# Patient Record
Sex: Male | Born: 1995 | Race: Black or African American | Hispanic: No | Marital: Single | Smoking: Never smoker
Health system: Southern US, Community
[De-identification: ages and names within clinical notes are randomized; demographics above are authoritative.]

## PROBLEM LIST (undated history)

## (undated) HISTORY — PX: NO PAST SURGERIES: SHX2092

---
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
## 2015-01-06 ENCOUNTER — Ambulatory Visit (INDEPENDENT_AMBULATORY_CARE_PROVIDER_SITE_OTHER): Payer: 59

## 2015-01-06 ENCOUNTER — Ambulatory Visit (INDEPENDENT_AMBULATORY_CARE_PROVIDER_SITE_OTHER): Payer: 59 | Admitting: Family Medicine

## 2015-01-06 VITALS — BP 140/108 | HR 86 | Temp 98.9°F | Resp 16 | Ht 69.5 in | Wt 176.0 lb

## 2015-01-06 DIAGNOSIS — M25562 Pain in left knee: Secondary | ICD-10-CM | POA: Diagnosis not present

## 2015-01-06 DIAGNOSIS — M7042 Prepatellar bursitis, left knee: Secondary | ICD-10-CM

## 2015-01-06 LAB — POCT CBC
Granulocyte percent: 78.8 %G (ref 37–80)
HEMATOCRIT: 45.7 % (ref 43.5–53.7)
Hemoglobin: 15.3 g/dL (ref 14.1–18.1)
LYMPH, POC: 2.2 (ref 0.6–3.4)
MCH, POC: 28.4 pg (ref 27–31.2)
MCHC: 33.4 g/dL (ref 31.8–35.4)
MCV: 84.9 fL (ref 80–97)
MID (cbc): 0.5 (ref 0–0.9)
MPV: 7.1 fL (ref 0–99.8)
POC Granulocyte: 10.1 — AB (ref 2–6.9)
POC LYMPH %: 17.2 % (ref 10–50)
POC MID %: 4 % (ref 0–12)
Platelet Count, POC: 194 10*3/uL (ref 142–424)
RBC: 5.38 M/uL (ref 4.69–6.13)
RDW, POC: 13 %
WBC: 12.8 10*3/uL — AB (ref 4.6–10.2)

## 2015-01-06 LAB — SYNOVIAL CELL COUNT + DIFF, W/ CRYSTALS: Crystals, Fluid: NONE SEEN

## 2015-01-06 LAB — POCT SEDIMENTATION RATE: POCT SED RATE: 6 mm/hr (ref 0–22)

## 2015-01-06 NOTE — Progress Notes (Signed)
  Subjective:  Patient ID: Sean [name], male    DOB: 10-12-1995  Age: 19 y.o. MRN: [id number]  Patient is here with a two-day history of swelling and pain in his left knee. He has been lifeguarding. He doesn't know of any injury. Does not recall when it happened but it just gradually got worse to visualize and Thursday so he came in this morning, Thursday. Has not had this problem before. He is going to be going to community college this winter.   Objective:   Left knee is visibly swollen. It is warm to touch. The swelling seems to be mostly in front of the patella. This area is tender, fluctuant, Russell joint space does not seem to be swollen or tender. No skin lesions are noted except for some old scars. Range of motion is good.  Assessment & Plan:   Assessment:  Prepatellar pain and swelling left knee  Plan:  X-ray left knee [facility] reading (PRIMARY) by  Dr. Alwyn Ren Normal knee  Results for orders placed or performed in visit on 01/06/15  POCT CBC  Result Value Ref Range   WBC 12.8 (A) 4.6 - 10.2 K/uL   Lymph, poc 2.2 0.6 - 3.4   POC LYMPH PERCENT 17.2 10 - 50 %L   MID (cbc) 0.5 0 - 0.9   POC MID % 4.0 0 - 12 %M   POC Granulocyte 10.1 (A) 2 - 6.9   Granulocyte percent 78.8 37 - 80 %G   RBC 5.38 4.69 - 6.13 M/uL   Hemoglobin 15.3 14.1 - 18.1 g/dL   HCT, POC 27.2 53.6 - 53.7 %   MCV 84.9 80 - 97 fL   MCH, POC 28.4 27 - 31.2 pg   MCHC 33.4 31.8 - 35.4 g/dL   RDW, POC 64.4 %   Platelet Count, POC 194.0 142 - 424 K/uL   MPV 7.1 0 - 99.8 fL   . Procedure note: 1% lidocaine was knee used to numb the skin on the left knee. Area was prepped with iodine and alcohol. Aspiration was attempted with a 18-gauge needle. Only a few drops of a yellowish fluid was obtained, then a little blood was obtained. Some of the fluid put on a culturette to be cultured, and the remainder sent for microscopic analysis. Specimen is not really adequate for a good exam.  I spoke with Dr. Madelon Lips at  [facility] and he will see the patient if the patient will go over there right now. Patient Instructions  Apply ice to joint for about 15 minutes 5 or 6 times today  Referral is being made to orthopedics.  I spoke to Dr. Madelon Lips at [facility], [address] [place] [phone] who will see you now   High acuity visit  Giles Currie, MD 01/06/2015

## 2015-01-06 NOTE — Patient Instructions (Addendum)
Apply ice to joint for about 15 minutes 5 or 6 times today  Referral is being made to orthopedics.  I spoke to Dr. Madelon Lips at [facility], [address] [place] [phone] who will see you now

## 2015-01-06 NOTE — Addendum Note (Signed)
Addended by: Johnnette Litter on: 01/06/2015 09:39 AM   Modules accepted: Orders, SmartSet

## 2015-01-07 ENCOUNTER — Encounter: Payer: Self-pay | Admitting: Family Medicine

## 2015-01-07 ENCOUNTER — Telehealth: Payer: Self-pay

## 2015-01-07 NOTE — Telephone Encounter (Signed)
Dr. Alwyn Ren, there was insufficient sample for them to run the Crystal Analysis on this pt's synovial fluid

## 2015-01-09 LAB — BODY FLUID CULTURE
Gram Stain: NONE SEEN
Organism ID, Bacteria: NO GROWTH

## 2015-01-10 NOTE — Telephone Encounter (Signed)
Insufficient sample noted. I thought that would happen. It was worth a try.

## 2015-01-11 ENCOUNTER — Encounter: Payer: Self-pay | Admitting: Family Medicine

## 2015-01-12 ENCOUNTER — Encounter (HOSPITAL_BASED_OUTPATIENT_CLINIC_OR_DEPARTMENT_OTHER): Payer: Self-pay | Admitting: *Deleted

## 2015-01-12 NOTE — H&P (Signed)
  [facility] [address]   [address] [place], [place] [address] [phone] A Division of [facility]  Loreta Ave, M.D.   Robert A. Thurston Hole, M.D.   Burnell Blanks, M.D.   Eulas Post, M.D.   Lunette Stands, M.D. Jewel Baize. Eulah Pont, M.D.  Buford Dresser, M.D.  Estell Harpin, M.D.    Melina Fiddler, M.D. Janalee Dane, PA- C  Mary L. Dub Mikes, PA-C  Kirstin A. Shepperson, PA-C  Josh Felts Mills, PA-C  [place], [place]  RE: Sean, [name]   [id number]      DOB: [birth year] PROGRESS NOTE: 01-11-2015  SUBJECTIVE: Sean [name] returns in follow-up. He saw Dr. Madelon Lips at the end of last week with a septic prepatellar bursitis. His bursa was opened and packed. He was started on Doxycycline. I had seen him in our after-hours urgent care on Saturday and the packing was removed. He was afebrile and had no systemic symptoms of illness.  He was seemingly improving. I continued him on oral antibiotics. He comes in today for a follow-up with again no fevers or chills. No constitutional signs of illness but still with pain to his knee.   Interval medical history is unremarkable and unchanged upon review.   OBJECTIVE: There is pus draining from the wound. There is some fluctuance to the lateral anterior prepatellar bursa. There is a surrounding significant warmth and erythema. There is no intra-articular effusion. He has a pain free range of motion. He is neurovascularly intact distally.   X-RAYS: None repeated.   ASSESSMENT: Septic left knee, prepatellar bursitis.   PLAN: Rocephin 250 mg IM injection. Continue with oral Doxycycline. Follow-up with Dr. Renaye Rakers tomorrow.   If no improvement with antibiotic injection anticipate a formal irrigation and debridement and washing out of his infected prepatellar bursa.   Estell Harpin, M.D.  Electronically verified by Estell Harpin, M.D. JSK:[id number]  D  01-11-2015 T   01-12-2015

## 2015-01-14 ENCOUNTER — Ambulatory Visit (HOSPITAL_BASED_OUTPATIENT_CLINIC_OR_DEPARTMENT_OTHER)
Admission: RE | Admit: 2015-01-14 | Discharge: 2015-01-14 | Disposition: A | Discharge status: Home / Self Care | Payer: 59 | Source: Ambulatory Visit | Attending: Orthopedic Surgery | Admitting: Orthopedic Surgery

## 2015-01-14 ENCOUNTER — Ambulatory Visit (HOSPITAL_BASED_OUTPATIENT_CLINIC_OR_DEPARTMENT_OTHER): Payer: 59 | Admitting: Anesthesiology

## 2015-01-14 ENCOUNTER — Encounter (HOSPITAL_BASED_OUTPATIENT_CLINIC_OR_DEPARTMENT_OTHER): Payer: Self-pay | Admitting: *Deleted

## 2015-01-14 ENCOUNTER — Encounter (HOSPITAL_BASED_OUTPATIENT_CLINIC_OR_DEPARTMENT_OTHER)
Admission: RE | Disposition: A | Discharge status: Home / Self Care | Payer: Self-pay | Source: Ambulatory Visit | Attending: Orthopedic Surgery

## 2015-01-14 DIAGNOSIS — M7042 Prepatellar bursitis, left knee: Secondary | ICD-10-CM | POA: Diagnosis not present

## 2015-01-14 DIAGNOSIS — M71062 Abscess of bursa, left knee: Secondary | ICD-10-CM | POA: Insufficient documentation

## 2015-01-14 HISTORY — PX: KNEE BURSECTOMY: SHX5882

## 2015-01-14 HISTORY — PX: I&D EXTREMITY: SHX5045

## 2015-01-14 LAB — POCT HEMOGLOBIN-HEMACUE: Hemoglobin: 15.3 g/dL (ref 13.0–17.0)

## 2015-01-14 SURGERY — BURSECTOMY, KNEE
Anesthesia: Regional | Site: Knee | Laterality: Left

## 2015-01-14 MED ORDER — LACTATED RINGERS IV SOLN
INTRAVENOUS | Status: DC
  Administered 2015-01-14: 12:00:00 via INTRAVENOUS

## 2015-01-14 MED ORDER — CEFAZOLIN SODIUM-DEXTROSE 2-3 GM-% IV SOLR
2.0000 g | INTRAVENOUS | Status: AC
  Administered 2015-01-14: 2 g via INTRAVENOUS

## 2015-01-14 MED ORDER — MIDAZOLAM HCL 2 MG/2ML IJ SOLN
INTRAMUSCULAR | Status: AC
  Filled 2015-01-14: qty 2

## 2015-01-14 MED ORDER — ACETAMINOPHEN 500 MG PO TABS
ORAL_TABLET | ORAL | Status: AC
  Filled 2015-01-14: qty 2

## 2015-01-14 MED ORDER — OXYCODONE HCL 5 MG PO TABS: 10.0000 mg | ORAL_TABLET | ORAL | Status: AC | PRN

## 2015-01-14 MED ORDER — OXYCODONE HCL 5 MG PO TABS: 5.0000 mg | ORAL_TABLET | Freq: Once | ORAL | Status: DC | PRN

## 2015-01-14 MED ORDER — DEXAMETHASONE SODIUM PHOSPHATE 4 MG/ML IJ SOLN
INTRAMUSCULAR | Status: DC | PRN
  Administered 2015-01-14: 10 mg via INTRAVENOUS

## 2015-01-14 MED ORDER — GLYCOPYRROLATE 0.2 MG/ML IJ SOLN: 0.2000 mg | Freq: Once | INTRAMUSCULAR | Status: DC | PRN

## 2015-01-14 MED ORDER — PROPOFOL 10 MG/ML IV BOLUS
INTRAVENOUS | Status: DC | PRN
  Administered 2015-01-14: 250 mg via INTRAVENOUS

## 2015-01-14 MED ORDER — FENTANYL CITRATE (PF) 100 MCG/2ML IJ SOLN
INTRAMUSCULAR | Status: AC
  Filled 2015-01-14: qty 8

## 2015-01-14 MED ORDER — OXYCODONE HCL 5 MG/5ML PO SOLN: 5.0000 mg | Freq: Once | ORAL | Status: DC | PRN

## 2015-01-14 MED ORDER — ACETAMINOPHEN 500 MG PO TABS
1000.0000 mg | ORAL_TABLET | Freq: Once | ORAL | Status: AC
  Administered 2015-01-14: 1000 mg via ORAL

## 2015-01-14 MED ORDER — LIDOCAINE HCL (CARDIAC) 20 MG/ML IV SOLN
INTRAVENOUS | Status: DC | PRN
  Administered 2015-01-14: 60 mg via INTRAVENOUS

## 2015-01-14 MED ORDER — DEXTROSE-NACL 5-0.45 % IV SOLN: 100.0000 mL/h | INTRAVENOUS | Status: DC

## 2015-01-14 MED ORDER — PROPOFOL 10 MG/ML IV BOLUS
INTRAVENOUS | Status: AC
  Filled 2015-01-14: qty 20

## 2015-01-14 MED ORDER — FENTANYL CITRATE (PF) 100 MCG/2ML IJ SOLN
50.0000 ug | INTRAMUSCULAR | Status: DC | PRN
  Administered 2015-01-14: 100 ug via INTRAVENOUS

## 2015-01-14 MED ORDER — ONDANSETRON HCL 4 MG/2ML IJ SOLN
INTRAMUSCULAR | Status: DC | PRN
  Administered 2015-01-14: 4 mg via INTRAVENOUS

## 2015-01-14 MED ORDER — BUPIVACAINE-EPINEPHRINE (PF) 0.5% -1:200000 IJ SOLN
INTRAMUSCULAR | Status: DC | PRN
  Administered 2015-01-14: 30 mL via PERINEURAL

## 2015-01-14 MED ORDER — CEFAZOLIN SODIUM-DEXTROSE 2-3 GM-% IV SOLR
INTRAVENOUS | Status: AC
  Filled 2015-01-14: qty 50

## 2015-01-14 MED ORDER — MIDAZOLAM HCL 2 MG/2ML IJ SOLN
1.0000 mg | INTRAMUSCULAR | Status: DC | PRN
  Administered 2015-01-14: 2 mg via INTRAVENOUS

## 2015-01-14 MED ORDER — SCOPOLAMINE 1 MG/3DAYS TD PT72: 1.0000 | MEDICATED_PATCH | Freq: Once | TRANSDERMAL | Status: DC | PRN

## 2015-01-14 MED ORDER — FENTANYL CITRATE (PF) 100 MCG/2ML IJ SOLN
INTRAMUSCULAR | Status: AC
  Filled 2015-01-14: qty 2

## 2015-01-14 MED ORDER — HYDROMORPHONE HCL 1 MG/ML IJ SOLN: 0.2500 mg | INTRAMUSCULAR | Status: DC | PRN

## 2015-01-14 MED ORDER — ONDANSETRON HCL 4 MG PO TABS: 4.0000 mg | ORAL_TABLET | Freq: Three times a day (TID) | ORAL | Status: AC | PRN

## 2015-01-14 MED ORDER — ONDANSETRON HCL 4 MG/2ML IJ SOLN: 4.0000 mg | Freq: Four times a day (QID) | INTRAMUSCULAR | Status: DC | PRN

## 2015-01-14 SURGICAL SUPPLY — 91 items
BAG DECANTER FOR FLEXI CONT (MISCELLANEOUS) IMPLANT
BANDAGE ELASTIC 4 VELCRO ST LF (GAUZE/BANDAGES/DRESSINGS) IMPLANT
BANDAGE ELASTIC 6 VELCRO ST LF (GAUZE/BANDAGES/DRESSINGS) ×3 IMPLANT
BANDAGE ESMARK 6X9 LF (GAUZE/BANDAGES/DRESSINGS) ×1 IMPLANT
BLADE SURG 10 STRL SS (BLADE) IMPLANT
BLADE SURG 15 STRL LF DISP TIS (BLADE) ×2 IMPLANT
BLADE SURG 15 STRL SS (BLADE) ×4
BNDG COHESIVE 6X5 TAN STRL LF (GAUZE/BANDAGES/DRESSINGS) IMPLANT
BNDG ESMARK 6X9 LF (GAUZE/BANDAGES/DRESSINGS) ×3
BNDG GAUZE ELAST 4 BULKY (GAUZE/BANDAGES/DRESSINGS) IMPLANT
CHLORAPREP W/TINT 26ML (MISCELLANEOUS) ×3 IMPLANT
CLEANER CAUTERY TIP 5X5 PAD (MISCELLANEOUS) IMPLANT
CLOSURE STERI-STRIP 1/2X4 (GAUZE/BANDAGES/DRESSINGS) ×1
CLSR STERI-STRIP ANTIMIC 1/2X4 (GAUZE/BANDAGES/DRESSINGS) ×2 IMPLANT
COVER BACK TABLE 80X110 HD (DRAPES) ×3 IMPLANT
COVER MAYO STAND STRL (DRAPES) IMPLANT
CUFF TOURNIQUET SINGLE 34IN LL (TOURNIQUET CUFF) ×3 IMPLANT
DECANTER SPIKE VIAL GLASS SM (MISCELLANEOUS) IMPLANT
DRAPE EXTREMITY T 121X128X90 (DRAPE) ×3 IMPLANT
DRAPE OEC MINIVIEW 54X84 (DRAPES) IMPLANT
DRAPE SURG 17X23 STRL (DRAPES) IMPLANT
DRAPE U 20/CS (DRAPES) IMPLANT
DRAPE U-SHAPE 47X51 STRL (DRAPES) ×3 IMPLANT
DRSG ADAPTIC 3X8 NADH LF (GAUZE/BANDAGES/DRESSINGS) ×3 IMPLANT
DRSG EMULSION OIL 3X3 NADH (GAUZE/BANDAGES/DRESSINGS) ×3 IMPLANT
DRSG PAD ABDOMINAL 8X10 ST (GAUZE/BANDAGES/DRESSINGS) ×3 IMPLANT
ELECT REM PT RETURN 9FT ADLT (ELECTROSURGICAL) ×3
ELECTRODE REM PT RTRN 9FT ADLT (ELECTROSURGICAL) ×1 IMPLANT
GAUZE IODOFORM PACK 1/2 7832 (GAUZE/BANDAGES/DRESSINGS) ×3 IMPLANT
GAUZE SPONGE 4X4 12PLY STRL (GAUZE/BANDAGES/DRESSINGS) ×3 IMPLANT
GLOVE BIO SURGEON STRL SZ 6.5 (GLOVE) ×2 IMPLANT
GLOVE BIO SURGEON STRL SZ7 (GLOVE) ×3 IMPLANT
GLOVE BIO SURGEON STRL SZ7.5 (GLOVE) ×3 IMPLANT
GLOVE BIO SURGEON STRL SZ8 (GLOVE) ×3 IMPLANT
GLOVE BIO SURGEONS STRL SZ 6.5 (GLOVE) ×1
GLOVE BIOGEL PI IND STRL 7.0 (GLOVE) ×2 IMPLANT
GLOVE BIOGEL PI IND STRL 8 (GLOVE) ×1 IMPLANT
GLOVE BIOGEL PI INDICATOR 7.0 (GLOVE) ×4
GLOVE BIOGEL PI INDICATOR 8 (GLOVE) ×2
GOWN STRL REUS W/ TWL LRG LVL3 (GOWN DISPOSABLE) ×1 IMPLANT
GOWN STRL REUS W/ TWL XL LVL3 (GOWN DISPOSABLE) ×2 IMPLANT
GOWN STRL REUS W/TWL LRG LVL3 (GOWN DISPOSABLE) ×2
GOWN STRL REUS W/TWL XL LVL3 (GOWN DISPOSABLE) ×4
IMMOBILIZER KNEE 22 UNIV (SOFTGOODS) IMPLANT
IMMOBILIZER KNEE 24 THIGH 36 (MISCELLANEOUS) IMPLANT
IMMOBILIZER KNEE 24 UNIV (MISCELLANEOUS)
NDL SUT 6 .5 CRC .975X.05 MAYO (NEEDLE) IMPLANT
NEEDLE HYPO 25X1 1.5 SAFETY (NEEDLE) IMPLANT
NEEDLE MAYO TAPER (NEEDLE)
NEEDLE MAYO TROCAR (NEEDLE) IMPLANT
NS IRRIG 1000ML POUR BTL (IV SOLUTION) ×3 IMPLANT
PACK ARTHROSCOPY DSU (CUSTOM PROCEDURE TRAY) ×3 IMPLANT
PACK BASIN DAY SURGERY FS (CUSTOM PROCEDURE TRAY) ×3 IMPLANT
PAD ARMBOARD 7.5X6 YLW CONV (MISCELLANEOUS) ×6 IMPLANT
PAD CAST 3X4 CTTN HI CHSV (CAST SUPPLIES) IMPLANT
PAD CAST 4YDX4 CTTN HI CHSV (CAST SUPPLIES) IMPLANT
PAD CLEANER CAUTERY TIP 5X5 (MISCELLANEOUS)
PADDING CAST COTTON 3X4 STRL (CAST SUPPLIES)
PADDING CAST COTTON 4X4 STRL (CAST SUPPLIES)
PADDING CAST COTTON 6X4 STRL (CAST SUPPLIES) ×3 IMPLANT
PENCIL BUTTON HOLSTER BLD 10FT (ELECTRODE) ×3 IMPLANT
SET IRRIG Y TYPE TUR BLADDER L (SET/KITS/TRAYS/PACK) ×3 IMPLANT
SLEEVE SCD COMPRESS KNEE MED (MISCELLANEOUS) IMPLANT
SPONGE GAUZE 4X4 12PLY STER LF (GAUZE/BANDAGES/DRESSINGS) ×3 IMPLANT
SPONGE LAP 18X18 X RAY DECT (DISPOSABLE) ×3 IMPLANT
SPONGE LAP 4X18 X RAY DECT (DISPOSABLE) ×3 IMPLANT
STAPLER VISISTAT 35W (STAPLE) IMPLANT
SUCTION FRAZIER TIP 10 FR DISP (SUCTIONS) IMPLANT
SUT ETHILON 3 0 PS 1 (SUTURE) ×3 IMPLANT
SUT FIBERWIRE #2 38 T-5 BLUE (SUTURE)
SUT MNCRL AB 4-0 PS2 18 (SUTURE) ×3 IMPLANT
SUT MON AB 2-0 CT1 36 (SUTURE) IMPLANT
SUT VIC AB 0 CT1 18XCR BRD 8 (SUTURE) IMPLANT
SUT VIC AB 0 CT1 27 (SUTURE) ×2
SUT VIC AB 0 CT1 27XBRD ANBCTR (SUTURE) ×1 IMPLANT
SUT VIC AB 0 CT1 8-18 (SUTURE)
SUT VIC AB 0 SH 27 (SUTURE) IMPLANT
SUT VIC AB 1 CT1 27 (SUTURE)
SUT VIC AB 1 CT1 27XBRD ANBCTR (SUTURE) IMPLANT
SUT VIC AB 2-0 SH 27 (SUTURE)
SUT VIC AB 2-0 SH 27XBRD (SUTURE) IMPLANT
SUTURE FIBERWR #2 38 T-5 BLUE (SUTURE) IMPLANT
SYR BULB 3OZ (MISCELLANEOUS) ×3 IMPLANT
SYR CONTROL 10ML LL (SYRINGE) IMPLANT
TOWEL OR 17X24 6PK STRL BLUE (TOWEL DISPOSABLE) ×3 IMPLANT
TOWEL OR NON WOVEN STRL DISP B (DISPOSABLE) ×3 IMPLANT
TRAY DSU PREP LF (CUSTOM PROCEDURE TRAY) IMPLANT
TUBE CONNECTING 20'X1/4 (TUBING) ×1
TUBE CONNECTING 20X1/4 (TUBING) ×2 IMPLANT
UNDERPAD 30X30 (UNDERPADS AND DIAPERS) ×3 IMPLANT
YANKAUER SUCT BULB TIP NO VENT (SUCTIONS) ×3 IMPLANT

## 2015-01-14 NOTE — Op Note (Signed)
01/14/2015  1:27 PM  PATIENT:  Sean [name]    PRE-OPERATIVE DIAGNOSIS:  prepatellar bursitis left knee abscess of bursa left knee   POST-OPERATIVE DIAGNOSIS:  Same  PROCEDURE:  LEFT KNEE PRE-PATELLAR BURSECTOMY, IRRIGATION AND DEBRIDEMENT LEFT KNEE ABSCESS   SURGEON:  MURPHY, TIMOTHY D, MD  ASSISTANT: none  ANESTHESIA:   gen  PREOPERATIVE INDICATIONS:  [name] is a  19 y.o. male with a diagnosis of prepatellar bursitis left knee abscess of bursa left knee  who failed conservative measures and elected for surgical management.    The risks benefits and alternatives were discussed with the patient preoperatively including but not limited to the risks of infection, bleeding, nerve injury, cardiopulmonary complications, the need for revision surgery, among others, and the patient was willing to proceed.  OPERATIVE IMPLANTS: none  OPERATIVE FINDINGS: abscess prepatellar bursa  BLOOD LOSS: 20  COMPLICATIONS: nonoe  TOURNIQUET TIME:  OPERATIVE PROCEDURE:  Patient was identified in the preoperative holding area and site was marked by me He was transported to the operating theater and placed on the table in supine position taking care to pad all bony prominences. After a preincinduction time out anesthesia was induced. The left lower extremity was prepped and draped in normal sterile fashion and a pre-incision timeout was performed. He received ancef for preoperative antibiotics.   I extended his vertical incision from his bedside I and D proximally as well as distally making a roughly 4 cm incision.  I probed bluntly and his prepatellar bursa there was a small amount of purulent fluid I probed opening up all pockets of possible abscess. It didn't probe significantly on the superior lateral aspect of his patella. I opened up this pocket sufficiently.  There is a small pocket on the inferior medial aspect of his patella as well. I then thoroughly irrigated with 1 L of  saline.  Next I debrided his skin edges as well as some dermis and fat using a Ronjair and scissors.  I then completed irrigation with the 3 L bag of saline.  I placed 1 inch packing into both of the abscess cavities. I closed this portion of his surgical incision loosely leaving room for drainage.  A sterile dressing was applied he was taking the PACU in stable condition.  POST OPERATIVE PLAN: WBAT, ambulate for dvt px, continue abx.     This note was generated using a template and dragon dictation system. In light of that, I have reviewed the note and all aspects of it are applicable to this case. Any dictation errors are due to the computerized dictation system.

## 2015-01-14 NOTE — Anesthesia Preprocedure Evaluation (Signed)
Anesthesia Evaluation  Patient identified by MRN, date of birth, ID band Patient awake    Reviewed: Allergy & Precautions, NPO status , Patient's Chart, lab work & pertinent test results  Airway Mallampati: I   Neck ROM: full    Dental   Pulmonary neg pulmonary ROS,  breath sounds clear to auscultation        Cardiovascular negative cardio ROS  Rhythm:regular Rate:Normal     Neuro/Psych    GI/Hepatic   Endo/Other    Renal/GU      Musculoskeletal   Abdominal   Peds  Hematology   Anesthesia Other Findings   Reproductive/Obstetrics                             Anesthesia Physical Anesthesia Plan  ASA: I  Anesthesia Plan: General and Regional   Post-op Pain Management: MAC Combined w/ Regional for Post-op pain   Induction: Intravenous  Airway Management Planned: LMA  Additional Equipment:   Intra-op Plan:   Post-operative Plan:   Informed Consent: I have reviewed the patients History and Physical, chart, labs and discussed the procedure including the risks, benefits and alternatives for the proposed anesthesia with the patient or authorized representative who has indicated his/her understanding and acceptance.     Plan Discussed with: CRNA, Anesthesiologist and Surgeon  Anesthesia Plan Comments:         Anesthesia Quick Evaluation

## 2015-01-14 NOTE — Discharge Instructions (Signed)
Keep covered until follow up  Keep it dry  Wear knee immobilizer full time   Post Anesthesia Home Care Instructions  Activity: Get plenty of rest for the remainder of the day. A responsible adult should stay with you for 24 hours following the procedure.  For the next 24 hours, DO NOT: -Drive a car -Advertising copywriter -Drink alcoholic beverages -Take any medication unless instructed by your physician -Make any legal decisions or sign important papers.  Meals: Start with liquid foods such as gelatin or soup. Progress to regular foods as tolerated. Avoid greasy, spicy, heavy foods. If nausea and/or vomiting occur, drink only clear liquids until the nausea and/or vomiting subsides. Call your physician if vomiting continues.  Special Instructions/Symptoms: Your throat may feel dry or sore from the anesthesia or the breathing tube placed in your throat during surgery. If this causes discomfort, gargle with warm salt water. The discomfort should disappear within 24 hours.  If you had a scopolamine patch placed behind your ear for the management of post- operative nausea and/or vomiting:  1. The medication in the patch is effective for 72 hours, after which it should be removed.  Wrap patch in a tissue and discard in the trash. Wash hands thoroughly with soap and water. 2. You may remove the patch earlier than 72 hours if you experience unpleasant side effects which may include dry mouth, dizziness or visual disturbances. 3. Avoid touching the patch. Wash your hands with soap and water after contact with the patch.    Regional Anesthesia Blocks  1. Numbness or the inability to move the "blocked" extremity may last from 3-48 hours after placement. The length of time depends on the medication injected and your individual response to the medication. If the numbness is not going away after 48 hours, call your surgeon.  2. The extremity that is blocked will need to be protected until the  numbness is gone and the  Strength has returned. Because you cannot feel it, you will need to take extra care to avoid injury. Because it may be weak, you may have difficulty moving it or using it. You may not know what position it is in without looking at it while the block is in effect.  3. For blocks in the legs and feet, returning to weight bearing and walking needs to be done carefully. You will need to wait until the numbness is entirely gone and the strength has returned. You should be able to move your leg and foot normally before you try and bear weight or walk. You will need someone to be with you when you first try to ensure you do not fall and possibly risk injury.  4. Bruising and tenderness at the needle site are common side effects and will resolve in a few days.  5. Persistent numbness or new problems with movement should be communicated to the surgeon or the [facility] [phone] [facility] [phone]).

## 2015-01-14 NOTE — Interval H&P Note (Signed)
History and Physical Interval Note:  01/14/2015 12:35 PM  Sean [name]  has presented today for surgery, with the diagnosis of prepatellar bursitis left knee abscess of bursa left knee   The various methods of treatment have been discussed with the patient and family. After consideration of risks, benefits and other options for treatment, the patient has consented to  Procedure(s): LEFT KNEE PRE-PATELLAR BURSECTOMY (Left) IRRIGATION AND DEBRIDEMENT LEFT KNEE ABSCESS  (Left) as a surgical intervention .  The patient's history has been reviewed, patient examined, no change in status, stable for surgery.  I have reviewed the patient's chart and labs.  Questions were answered to the patient's satisfaction.     Dania Marsan D

## 2015-01-14 NOTE — Anesthesia Postprocedure Evaluation (Signed)
Anesthesia Post Note  Patient: Sean [name]  Procedure(s) Performed: Procedure(s) (LRB): LEFT KNEE PRE-PATELLAR BURSECTOMY (Left) IRRIGATION AND DEBRIDEMENT LEFT KNEE ABSCESS  (Left)  Anesthesia type: General  Patient location: PACU  Post pain: Pain level controlled and Adequate analgesia  Post assessment: Post-op Vital signs reviewed, Patient's Cardiovascular Status Stable, Respiratory Function Stable, Patent Airway and Pain level controlled  Last Vitals:  Filed Vitals:   01/14/15 1415  BP: 118/61  Pulse: 48  Temp:   Resp: 11    Post vital signs: Reviewed and stable  Level of consciousness: awake, alert  and oriented  Complications: No apparent anesthesia complications

## 2015-01-14 NOTE — Progress Notes (Signed)
Assisted Dr. Hodierne with left, ultrasound guided, femoral block. Side rails up, monitors on throughout procedure. See vital signs in flow sheet. Tolerated Procedure well. 

## 2015-01-14 NOTE — Transfer of Care (Signed)
Immediate Anesthesia Transfer of Care Note  Patient: Sean [name]  Procedure(s) Performed: Procedure(s): LEFT KNEE PRE-PATELLAR BURSECTOMY (Left) IRRIGATION AND DEBRIDEMENT LEFT KNEE ABSCESS  (Left)  Patient Location: PACU  Anesthesia Type:GA combined with regional for post-op pain  Level of Consciousness: sedated  Airway & Oxygen Therapy: Patient Spontanous Breathing and Patient connected to face mask oxygen  Post-op Assessment: Report given to RN and Post -op Vital signs reviewed and stable  Post vital signs: Reviewed and stable  Last Vitals:  Filed Vitals:   01/14/15 1235  BP:   Pulse: 54  Temp:   Resp: 20    Complications: No apparent anesthesia complications

## 2015-01-14 NOTE — Anesthesia Procedure Notes (Addendum)
Tribune CompanyScientist, research (physical sciences)Garey HamNature conservation officer [id number] Deist

## 2015-01-17 ENCOUNTER — Encounter (HOSPITAL_BASED_OUTPATIENT_CLINIC_OR_DEPARTMENT_OTHER): Payer: Self-pay | Admitting: Orthopedic Surgery

## 2016-08-12 IMAGING — CR DG KNEE COMPLETE 4+V*L*
4 series · 4 of 4 positions shown · non-contrast
Comparison: None.

CLINICAL DATA: Left knee pain.

EXAM:
LEFT KNEE - COMPLETE 4+ VIEW

[AP]
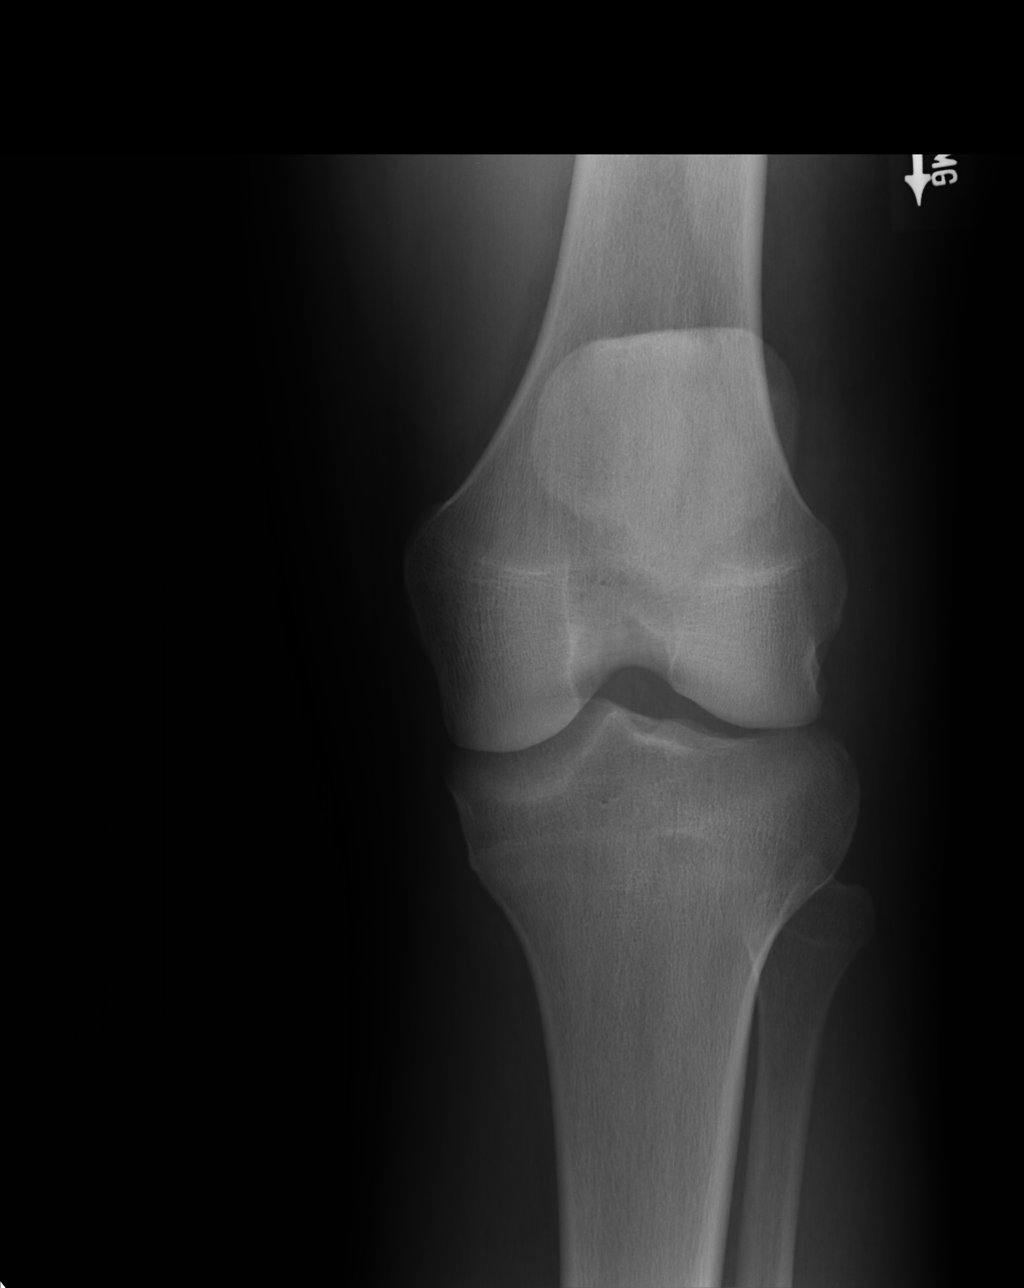

[ap axial]
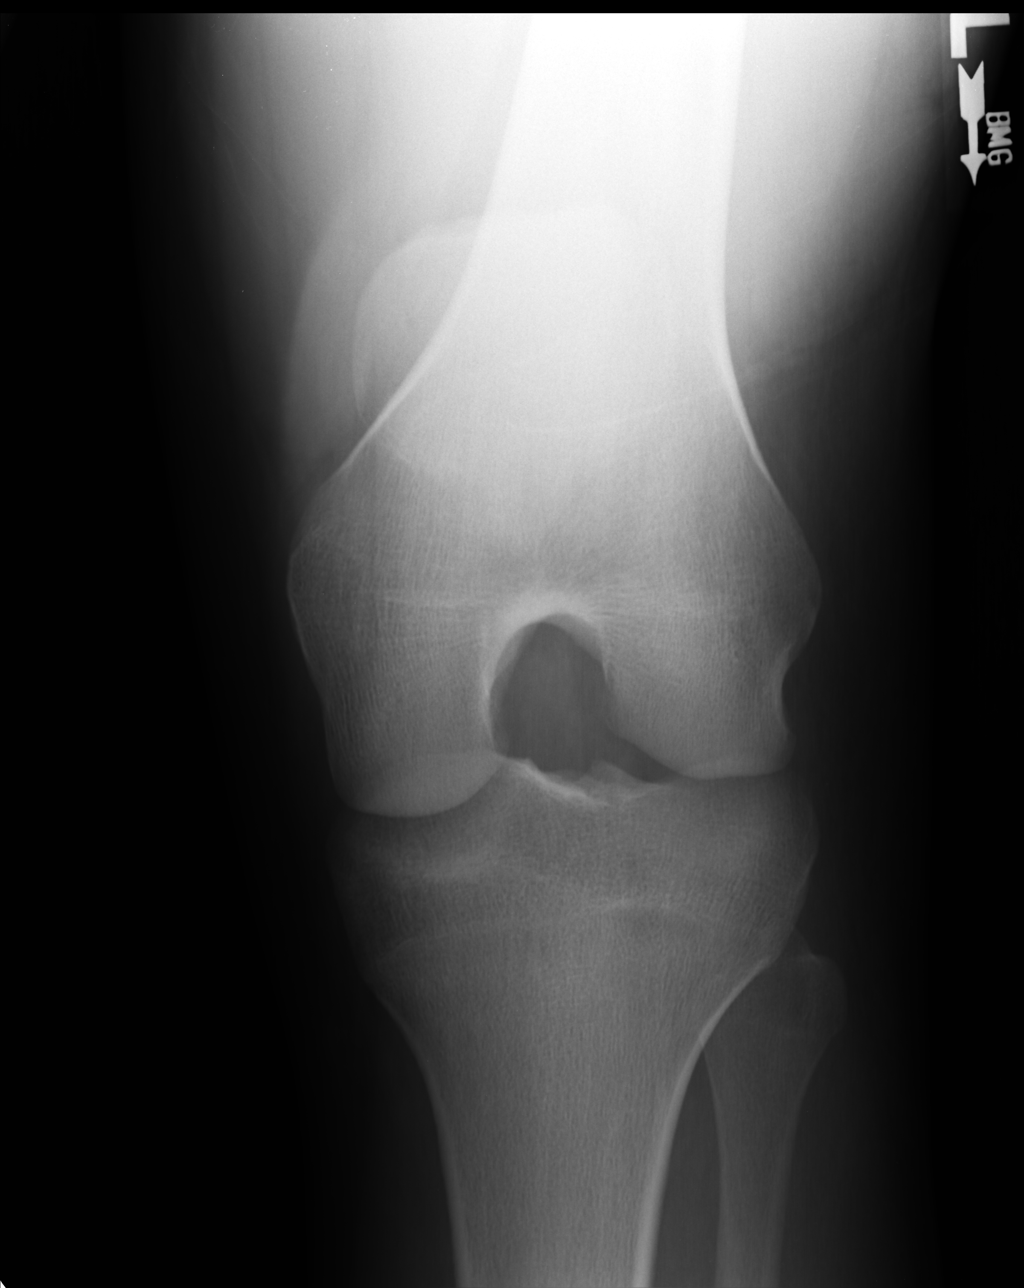

[lateral]
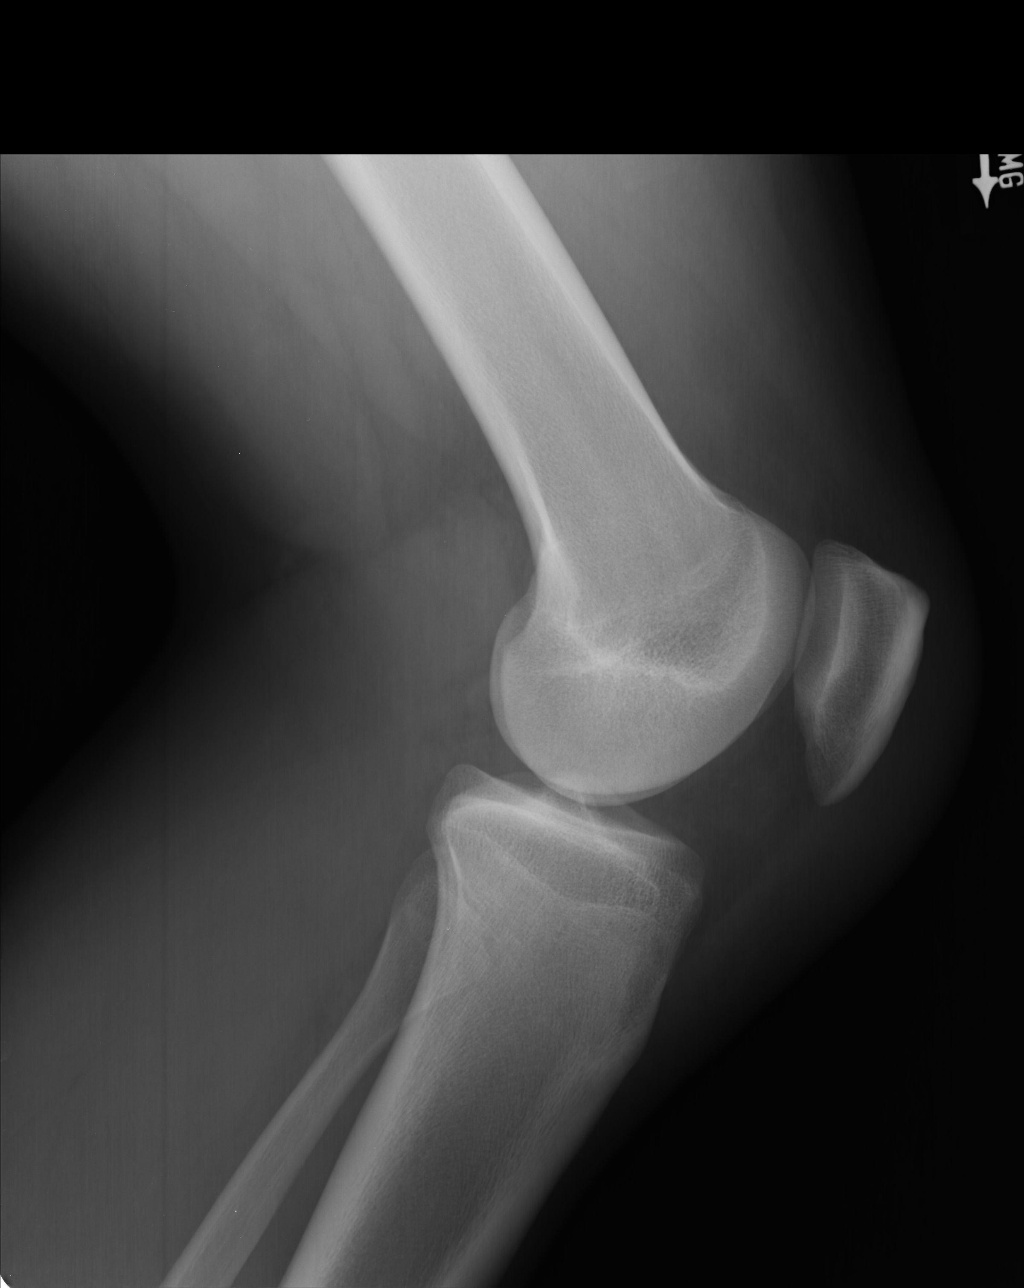

[sunrise]
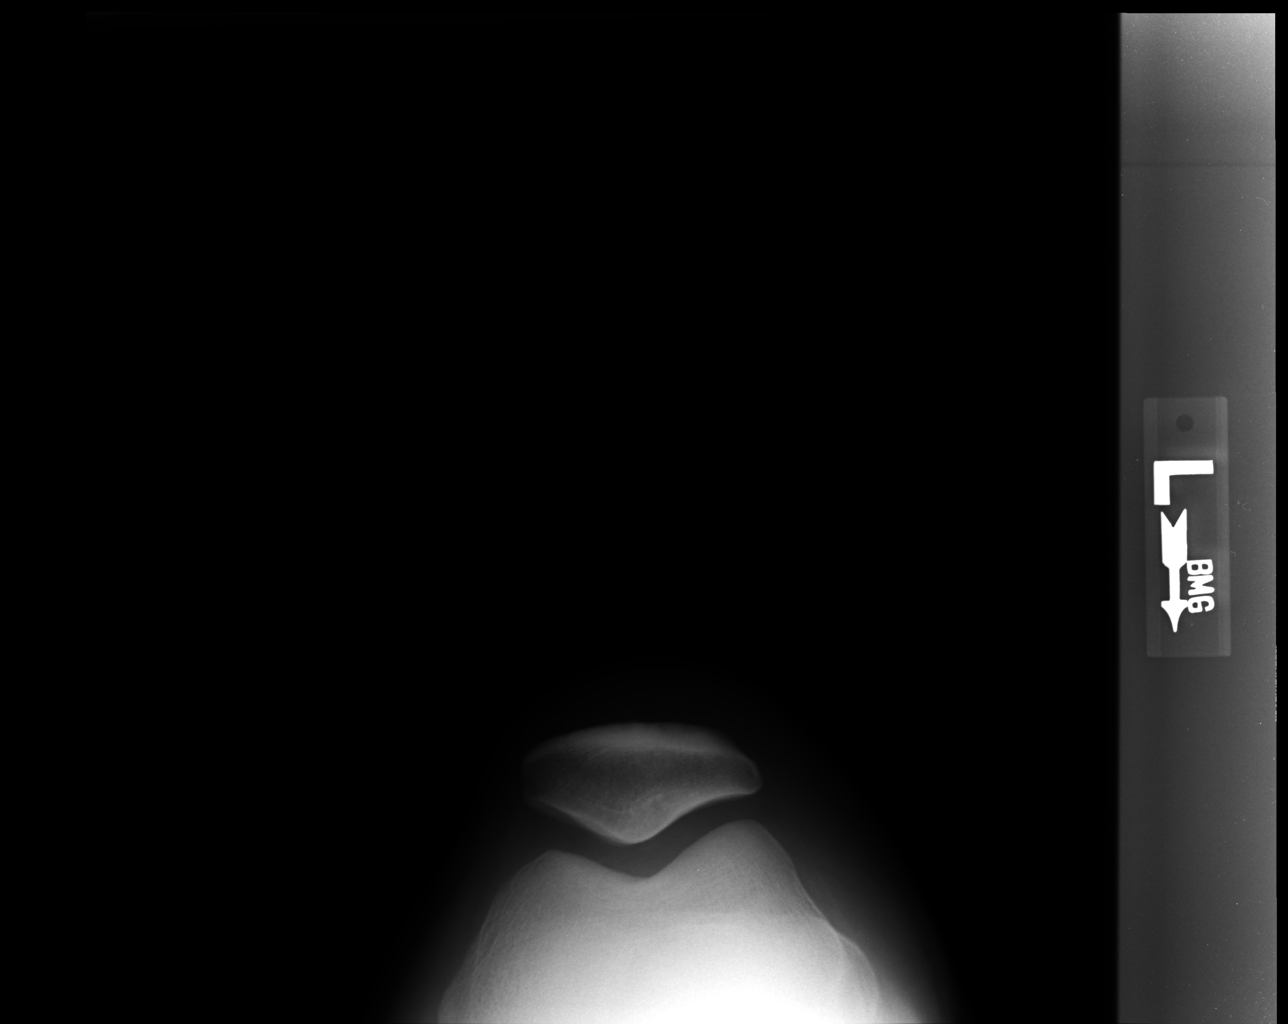

[4 of 4 positions shown; findings below may reference images not displayed]

FINDINGS: The osseous structures are normal. No joint effusion. Prominent soft
tissue swelling over the patella and patellar tendon. This could
represent prepatellar bursitis.
IMPRESSION: Possible prepatellar bursitis.
